# Patient Record
Sex: Female | Born: 1955 | Race: Black or African American | Hispanic: No | Marital: Single | Smoking: Never smoker
Health system: Southern US, Community
[De-identification: ages and names within clinical notes are randomized; demographics above are authoritative.]

## PROBLEM LIST (undated history)

## (undated) DIAGNOSIS — E039 Hypothyroidism, unspecified: Secondary | ICD-10-CM

## (undated) DIAGNOSIS — D219 Benign neoplasm of connective and other soft tissue, unspecified: Secondary | ICD-10-CM

## (undated) DIAGNOSIS — B9689 Other specified bacterial agents as the cause of diseases classified elsewhere: Secondary | ICD-10-CM

## (undated) DIAGNOSIS — A599 Trichomoniasis, unspecified: Secondary | ICD-10-CM

## (undated) DIAGNOSIS — N920 Excessive and frequent menstruation with regular cycle: Secondary | ICD-10-CM

## (undated) DIAGNOSIS — N760 Acute vaginitis: Secondary | ICD-10-CM

## (undated) DIAGNOSIS — L680 Hirsutism: Secondary | ICD-10-CM

## (undated) DIAGNOSIS — E282 Polycystic ovarian syndrome: Secondary | ICD-10-CM

## (undated) DIAGNOSIS — I10 Essential (primary) hypertension: Secondary | ICD-10-CM

## (undated) HISTORY — DX: Excessive and frequent menstruation with regular cycle: N92.0

## (undated) HISTORY — PX: DILATION AND CURETTAGE OF UTERUS: SHX78

## (undated) HISTORY — DX: Other specified bacterial agents as the cause of diseases classified elsewhere: B96.89

## (undated) HISTORY — DX: Polycystic ovarian syndrome: E28.2

## (undated) HISTORY — PX: HYSTEROSCOPY: SHX211

## (undated) HISTORY — DX: Hirsutism: L68.0

## (undated) HISTORY — DX: Benign neoplasm of connective and other soft tissue, unspecified: D21.9

## (undated) HISTORY — DX: Essential (primary) hypertension: I10

## (undated) HISTORY — DX: Trichomoniasis, unspecified: A59.9

## (undated) HISTORY — DX: Acute vaginitis: N76.0

## (undated) HISTORY — PX: TUBAL LIGATION: SHX77

## (undated) HISTORY — DX: Hypothyroidism, unspecified: E03.9

---

## 1997-12-22 ENCOUNTER — Other Ambulatory Visit: Admission: RE | Admit: 1997-12-22 | Discharge: 1997-12-22 | Payer: Self-pay | Admitting: Obstetrics and Gynecology

## 1999-07-14 ENCOUNTER — Other Ambulatory Visit: Admission: RE | Admit: 1999-07-14 | Discharge: 1999-07-14 | Payer: Self-pay | Admitting: Obstetrics and Gynecology

## 1999-07-15 ENCOUNTER — Encounter (INDEPENDENT_AMBULATORY_CARE_PROVIDER_SITE_OTHER): Payer: Self-pay

## 1999-07-15 ENCOUNTER — Other Ambulatory Visit: Admission: RE | Admit: 1999-07-15 | Discharge: 1999-07-15 | Payer: Self-pay | Admitting: Obstetrics and Gynecology

## 2000-08-15 ENCOUNTER — Encounter: Payer: Self-pay | Admitting: Internal Medicine

## 2000-08-15 ENCOUNTER — Ambulatory Visit (HOSPITAL_COMMUNITY): Admission: RE | Admit: 2000-08-15 | Discharge: 2000-08-15 | Payer: Self-pay | Admitting: Internal Medicine

## 2000-09-10 ENCOUNTER — Other Ambulatory Visit: Admission: RE | Admit: 2000-09-10 | Discharge: 2000-09-10 | Payer: Self-pay | Admitting: Obstetrics and Gynecology

## 2000-09-18 ENCOUNTER — Ambulatory Visit (HOSPITAL_COMMUNITY): Admission: RE | Admit: 2000-09-18 | Discharge: 2000-09-18 | Payer: Self-pay | Admitting: Obstetrics and Gynecology

## 2000-09-18 ENCOUNTER — Encounter: Payer: Self-pay | Admitting: Obstetrics and Gynecology

## 2001-09-30 ENCOUNTER — Other Ambulatory Visit: Admission: RE | Admit: 2001-09-30 | Discharge: 2001-09-30 | Payer: Self-pay | Admitting: Obstetrics and Gynecology

## 2002-10-22 ENCOUNTER — Other Ambulatory Visit: Admission: RE | Admit: 2002-10-22 | Discharge: 2002-10-22 | Payer: Self-pay | Admitting: Obstetrics and Gynecology

## 2003-10-28 ENCOUNTER — Other Ambulatory Visit: Admission: RE | Admit: 2003-10-28 | Discharge: 2003-10-28 | Payer: Self-pay | Admitting: Obstetrics and Gynecology

## 2003-11-12 ENCOUNTER — Ambulatory Visit (HOSPITAL_COMMUNITY): Admission: RE | Admit: 2003-11-12 | Discharge: 2003-11-12 | Payer: Self-pay | Admitting: Obstetrics and Gynecology

## 2004-10-31 ENCOUNTER — Other Ambulatory Visit: Admission: RE | Admit: 2004-10-31 | Discharge: 2004-10-31 | Payer: Self-pay | Admitting: Obstetrics and Gynecology

## 2007-10-02 ENCOUNTER — Encounter: Payer: Self-pay | Admitting: Internal Medicine

## 2007-10-02 ENCOUNTER — Ambulatory Visit: Payer: Self-pay | Admitting: Cardiovascular Disease

## 2007-10-02 ENCOUNTER — Ambulatory Visit (HOSPITAL_COMMUNITY): Admission: RE | Admit: 2007-10-02 | Discharge: 2007-10-02 | Payer: Self-pay | Admitting: Internal Medicine

## 2009-04-02 ENCOUNTER — Emergency Department (HOSPITAL_COMMUNITY): Admission: EM | Admit: 2009-04-02 | Discharge: 2009-04-03 | Payer: Self-pay | Admitting: Emergency Medicine

## 2009-06-03 ENCOUNTER — Ambulatory Visit (HOSPITAL_BASED_OUTPATIENT_CLINIC_OR_DEPARTMENT_OTHER): Admission: RE | Admit: 2009-06-03 | Discharge: 2009-06-03 | Payer: Self-pay | Admitting: Internal Medicine

## 2009-06-05 ENCOUNTER — Ambulatory Visit: Payer: Self-pay | Admitting: Internal Medicine

## 2010-04-10 ENCOUNTER — Encounter: Payer: Self-pay | Admitting: Occupational Medicine

## 2010-08-23 ENCOUNTER — Encounter: Payer: Self-pay | Admitting: Internal Medicine

## 2010-08-25 ENCOUNTER — Institutional Professional Consult (permissible substitution): Payer: Self-pay | Admitting: Internal Medicine

## 2010-09-29 ENCOUNTER — Institutional Professional Consult (permissible substitution): Payer: Self-pay | Admitting: Internal Medicine

## 2010-10-21 ENCOUNTER — Institutional Professional Consult (permissible substitution): Payer: Self-pay | Admitting: Internal Medicine

## 2011-12-20 ENCOUNTER — Ambulatory Visit (INDEPENDENT_AMBULATORY_CARE_PROVIDER_SITE_OTHER): Payer: Federal, State, Local not specified - PPO | Admitting: Obstetrics and Gynecology

## 2011-12-20 ENCOUNTER — Encounter: Payer: Self-pay | Admitting: Obstetrics and Gynecology

## 2011-12-20 VITALS — BP 138/78 | Ht 67.5 in | Wt 253.0 lb

## 2011-12-20 DIAGNOSIS — Z124 Encounter for screening for malignant neoplasm of cervix: Secondary | ICD-10-CM

## 2011-12-20 DIAGNOSIS — E039 Hypothyroidism, unspecified: Secondary | ICD-10-CM | POA: Insufficient documentation

## 2011-12-20 DIAGNOSIS — N920 Excessive and frequent menstruation with regular cycle: Secondary | ICD-10-CM | POA: Insufficient documentation

## 2011-12-20 DIAGNOSIS — A5901 Trichomonal vulvovaginitis: Secondary | ICD-10-CM

## 2011-12-20 DIAGNOSIS — N898 Other specified noninflammatory disorders of vagina: Secondary | ICD-10-CM

## 2011-12-20 LAB — POCT WET PREP (WET MOUNT): Clue Cells Wet Prep Whiff POC: POSITIVE

## 2011-12-20 LAB — POCT OSOM BVBLUE TEST: Bacterial Vaginosis: POSITIVE

## 2011-12-20 MED ORDER — ESTRADIOL-NORETHINDRONE ACET 0.05-0.25 MG/DAY TD PTTW: 1.0000 | MEDICATED_PATCH | TRANSDERMAL | Status: DC

## 2011-12-20 MED ORDER — FLUCONAZOLE 150 MG PO TABS: 150.0000 mg | ORAL_TABLET | Freq: Once | ORAL | Status: DC

## 2011-12-20 MED ORDER — METRONIDAZOLE 500 MG PO TABS: ORAL_TABLET | ORAL | Status: DC

## 2011-12-20 NOTE — Progress Notes (Signed)
aex Last Pap: 11/09/2010 WNL: Yes Regular Periods:no Contraception: BTL  Monthly Breast exam:yes Tetanus<51yrs:no Nl.Bladder Function:yes Daily BMs:yes Healthy Diet:yes Calcium:no Mammogram:yes Date of Mammogram: 02/13/2011 Exercise:yes Have often Exercise: 3 times weekly Seatbelt: yes Abuse at home: no Stressful work:no Sigmoid-colonoscopy: 2009  Polyps at [facility] GI Bone Density: No PCP: Dr. Chestine Spore Change in PMH: None Change in [facility]: None Pt c/o white milky vaginal discharge with no odor.  Subjective:    Marie Elliott is a 56 y.o. female G2P0 who presents for annual exam.  The patient has no complaints today. No vaginal bleeding. No rectal bleeding.  Has hx of colon polyps Good relief of hot flashes with Combipatch.  The following portions of the patient's history were reviewed and updated as appropriate: allergies, current medications, past family history, past medical history, past social history, past surgical history and problem list.  Review of Systems Pertinent items are noted in HPI. Gastrointestinal:No change in bowel habits, no abdominal pain, no rectal bleeding Genitourinary:negative for dysuria, frequency, hematuria, nocturia and urinary incontinence    Objective:     BP 138/78  Ht 5' 7.5" (1.715 m)  Wt 253 lb (114.76 kg)  BMI 39.04 kg/m2  Weight:  Wt Readings from Last 1 Encounters:  12/20/11 253 lb (114.76 kg)     BMI: Body mass index is 39.04 kg/(m^2). General Appearance: Alert, appropriate appearance for age. No acute distress HEENT: Grossly normal Neck / Thyroid: Supple, no masses, nodes or enlargement Lungs: clear to auscultation bilaterally Back: No CVA tenderness Breast Exam: No masses or nodes.No dimpling, nipple retraction or discharge. Cardiovascular: Regular rate and rhythm. S1, S2, no murmur Gastrointestinal: Soft, non-tender, no masses or organomegaly Pelvic Exam: External genitalia: normal general appearance Vaginal: atrophic  mucosa, discharge, pink and contact bleeding from vaginal sidewalls Cervix: atrophic with contact bleeding Adnexa: non palpable Uterus: doesnt feel enlarged Exam limited by body habitus Rectovaginal: normal rectal, no masses Lymphatic Exam: Non-palpable nodes in neck, clavicular, axillary, or inguinal regions Skin: no rash or abnormalities Neurologic: Normal gait and speech, no tremor  Psychiatric: Alert and oriented, appropriate affect.    Urinalysis:Not done OSOM BV: NEG OSOM TRICH:POS WET PREP: TRICH   Assessment:    Hormone replacement therapy  Recurrent vs persistent trichomonas   Plan:   mammogram pap smear return 4 weeks for wet prep and OSOM Follow-up:  for annual exam Metronidazole x 14 days for pt Metronidazole 2 gm stat for partner, Marie Elliott Condoms until clear at F/U

## 2011-12-22 LAB — PAP IG AND HPV HIGH-RISK: HPV DNA High Risk: NOT DETECTED

## 2012-01-25 ENCOUNTER — Encounter: Payer: Federal, State, Local not specified - PPO | Admitting: Obstetrics and Gynecology

## 2012-07-18 ENCOUNTER — Ambulatory Visit (INDEPENDENT_AMBULATORY_CARE_PROVIDER_SITE_OTHER): Payer: Federal, State, Local not specified - PPO | Admitting: Physician Assistant

## 2012-07-18 VITALS — BP 155/90 | HR 96 | Temp 98.1°F | Resp 18 | Ht 67.5 in | Wt 257.0 lb

## 2012-07-18 DIAGNOSIS — M62838 Other muscle spasm: Secondary | ICD-10-CM

## 2012-07-18 MED ORDER — MELOXICAM 15 MG PO TABS: 15.0000 mg | ORAL_TABLET | Freq: Every day | ORAL | Status: DC

## 2012-07-18 MED ORDER — CYCLOBENZAPRINE HCL 10 MG PO TABS: 10.0000 mg | ORAL_TABLET | Freq: Three times a day (TID) | ORAL | Status: DC | PRN

## 2012-07-18 NOTE — Progress Notes (Signed)
  Subjective:    Patient ID: Marie Elliott, female    DOB: 04-Nov-1955, 57 y.o.   MRN: [id number]  HPI   Marie Elliott is a very pleasant 57 yr old female here with concern for left shoulder pain.  States the shoulder began aching about 2 weeks ago.  Does not recall a mechanism of injury.  Does state that she carries her purse on that shoulder and that is often heavy.  Also carries a laptop bag on that shoulder occasionally.  Sleeps on the left side as well.  No specific movements exaggerate the pain, it just always aches.  The right side feels ok.  Has been doing up to 4 Aleve per day, though she is not consistently doing so.  When the weather is warm, the shoulder bothers her less.  Has been icing occasionally.     Review of Systems  Musculoskeletal: Positive for myalgias (left shoulder, left side neck).  Neurological: Negative for weakness and numbness.  All other systems reviewed and are negative.       Objective:   Physical Exam  Vitals reviewed. Constitutional: She is oriented to person, place, and time. She appears well-developed and well-nourished. No distress.  HENT:  Head: Normocephalic and atraumatic.  Eyes: Conjunctivae are normal. No scleral icterus.  Cardiovascular: Normal rate, regular rhythm and normal heart sounds.   Pulmonary/Chest: Effort normal and breath sounds normal. She has no wheezes. She has no rales.  Musculoskeletal:       Right shoulder: Normal.       Left shoulder: She exhibits normal range of motion, no tenderness, no bony tenderness, no crepitus and normal strength.       Cervical back: She exhibits spasm.       Back:  TTP and spasm over left trap  Neurological: She is alert and oriented to person, place, and time.  Skin: Skin is warm and dry.  Psychiatric: She has a normal mood and affect. Her behavior is normal.     Filed Vitals:   07/18/12 1417  BP: 155/90  Pulse: 96  Temp: 98.1 F (36.7 C)  Resp: 18        Assessment & Plan:    Trapezius muscle spasm - Plan: meloxicam (MOBIC) 15 MG tablet, cyclobenzaprine (FLEXERIL) 10 MG tablet   Marie Elliott is a very pleasant 57 yr old female here with 2 weeks of left shoulder pain.  On exam, shoulder joint itself is normal.  Full AROM and strength.  Tenderness and spasm over left trapezius.  Will try Mobic and Flexeril for 1-2 weeks.  Tylenol if needed for breakthrough pain.  Encouraged pt to carry purse/laptop on other shoulder.  Heat 2-3 times per day.  Instructed pt in gentle trap stretches.  If no improvement in 1-2 weeks may consider physical therapy.  Pt understands and is in agreement with this plan.

## 2012-07-18 NOTE — Patient Instructions (Addendum)
Begin taking the meloxicam (Mobic) once daily for the next 7-14 days.  (Do not take any additional Advil or Aleve while using the medication, if you need further pain relief you may take Tylenol)  Also begin taking cyclobenzaprine (Flexeril) at bedtime - if it does not make you too sleepy, you can take it every 8 hours.  Begin using heat to the area 2-3 times per day.  Do not carry anything on that shoulder for the next week.  Hopefully you will begin to see gradual improvement over this week.  If in two weeks you are not improved, we can consider trying physical therapy.

## 2014-05-21 ENCOUNTER — Ambulatory Visit (INDEPENDENT_AMBULATORY_CARE_PROVIDER_SITE_OTHER): Payer: Federal, State, Local not specified - PPO

## 2014-05-21 ENCOUNTER — Ambulatory Visit (INDEPENDENT_AMBULATORY_CARE_PROVIDER_SITE_OTHER): Payer: Federal, State, Local not specified - PPO | Admitting: Family Medicine

## 2014-05-21 VITALS — BP 140/88 | HR 78 | Temp 97.3°F | Resp 18 | Ht 67.5 in | Wt 246.8 lb

## 2014-05-21 DIAGNOSIS — M542 Cervicalgia: Secondary | ICD-10-CM | POA: Diagnosis not present

## 2014-05-21 DIAGNOSIS — M4317 Spondylolisthesis, lumbosacral region: Secondary | ICD-10-CM

## 2014-05-21 DIAGNOSIS — M549 Dorsalgia, unspecified: Secondary | ICD-10-CM

## 2014-05-21 DIAGNOSIS — M5137 Other intervertebral disc degeneration, lumbosacral region: Secondary | ICD-10-CM

## 2014-05-21 DIAGNOSIS — M47817 Spondylosis without myelopathy or radiculopathy, lumbosacral region: Secondary | ICD-10-CM

## 2014-05-21 DIAGNOSIS — M62830 Muscle spasm of back: Secondary | ICD-10-CM

## 2014-05-21 DIAGNOSIS — M51379 Other intervertebral disc degeneration, lumbosacral region without mention of lumbar back pain or lower extremity pain: Secondary | ICD-10-CM

## 2014-05-21 MED ORDER — MELOXICAM 7.5 MG PO TABS: 7.5000 mg | ORAL_TABLET | Freq: Every day | ORAL | Status: DC

## 2014-05-21 MED ORDER — CYCLOBENZAPRINE HCL 10 MG PO TABS: 10.0000 mg | ORAL_TABLET | Freq: Three times a day (TID) | ORAL | Status: DC | PRN
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
## 2014-06-09 ENCOUNTER — Other Ambulatory Visit: Payer: Self-pay | Admitting: Radiology

## 2014-11-25 ENCOUNTER — Ambulatory Visit (INDEPENDENT_AMBULATORY_CARE_PROVIDER_SITE_OTHER): Payer: Federal, State, Local not specified - PPO | Admitting: Family Medicine

## 2014-11-25 VITALS — BP 138/82 | HR 86 | Temp 98.2°F | Resp 16 | Ht 67.5 in | Wt 250.8 lb

## 2014-11-25 DIAGNOSIS — J0140 Acute pansinusitis, unspecified: Secondary | ICD-10-CM

## 2014-11-25 MED ORDER — PREDNISONE 20 MG PO TABS: ORAL_TABLET | ORAL | Status: DC

## 2014-11-25 MED ORDER — HYDROCOD POLST-CPM POLST ER 10-8 MG/5ML PO SUER: 5.0000 mL | Freq: Every evening | ORAL | Status: DC | PRN

## 2014-11-25 MED ORDER — AMOXICILLIN-POT CLAVULANATE 875-125 MG PO TABS: 1.0000 | ORAL_TABLET | Freq: Two times a day (BID) | ORAL | Status: DC

## 2014-11-25 NOTE — Patient Instructions (Signed)
Consider trying afrin for 3d only and pull out your netti pot/sinus rinse as well.  Sinusitis Sinusitis is redness, soreness, and inflammation of the paranasal sinuses. Paranasal sinuses are air pockets within the bones of your face (beneath the eyes, the middle of the forehead, or above the eyes). In healthy paranasal sinuses, mucus is able to drain out, and air is able to circulate through them by way of your nose. However, when your paranasal sinuses are inflamed, mucus and air can become trapped. This can allow bacteria and other germs to grow and cause infection. Sinusitis can develop quickly and last only a short time (acute) or continue over a long period (chronic). Sinusitis that lasts for more than 12 weeks is considered chronic.  CAUSES  Causes of sinusitis include:  Allergies.  Structural abnormalities, such as displacement of the cartilage that separates your nostrils (deviated septum), which can decrease the air flow through your nose and sinuses and affect sinus drainage.  Functional abnormalities, such as when the small hairs (cilia) that line your sinuses and help remove mucus do not work properly or are not present. SIGNS AND SYMPTOMS  Symptoms of acute and chronic sinusitis are the same. The primary symptoms are pain and pressure around the affected sinuses. Other symptoms include:  Upper toothache.  Earache.  Headache.  Bad breath.  Decreased sense of smell and taste.  A cough, which worsens when you are lying flat.  Fatigue.  Fever.  Thick drainage from your nose, which often is green and may contain pus (purulent).  Swelling and warmth over the affected sinuses. DIAGNOSIS  Your health care provider will perform a physical exam. During the exam, your health care provider may:  Look in your nose for signs of abnormal growths in your nostrils (nasal polyps).  Tap over the affected sinus to check for signs of infection.  View the inside of your sinuses  (endoscopy) using an imaging device that has a light attached (endoscope). If your health care provider suspects that you have chronic sinusitis, one or more of the following tests may be recommended:  Allergy tests.  Nasal culture. A sample of mucus is taken from your nose, sent to a lab, and screened for bacteria.  Nasal cytology. A sample of mucus is taken from your nose and examined by your health care provider to determine if your sinusitis is related to an allergy. TREATMENT  Most cases of acute sinusitis are related to a viral infection and will resolve on their own within 10 days. Sometimes medicines are prescribed to help relieve symptoms (pain medicine, decongestants, nasal steroid sprays, or saline sprays).  However, for sinusitis related to a bacterial infection, your health care provider will prescribe antibiotic medicines. These are medicines that will help kill the bacteria causing the infection.  Rarely, sinusitis is caused by a fungal infection. In theses cases, your health care provider will prescribe antifungal medicine. For some cases of chronic sinusitis, surgery is needed. Generally, these are cases in which sinusitis recurs more than 3 times per year, despite other treatments. HOME CARE INSTRUCTIONS   Drink plenty of water. Water helps thin the mucus so your sinuses can drain more easily.  Use a humidifier.  Inhale steam 3 to 4 times a day (for example, sit in the bathroom with the shower running).  Apply a warm, moist washcloth to your face 3 to 4 times a day, or as directed by your health care provider.  Use saline nasal sprays to help moisten  and clean your sinuses.  Take medicines only as directed by your health care provider.  If you were prescribed either an antibiotic or antifungal medicine, finish it all even if you start to feel better. SEEK IMMEDIATE MEDICAL CARE IF:  You have increasing pain or severe headaches.  You have nausea, vomiting, or  drowsiness.  You have swelling around your face.  You have vision problems.  You have a stiff neck.  You have difficulty breathing. MAKE SURE YOU:   Understand these instructions.  Will watch your condition.  Will get help right away if you are not doing well or get worse. Document Released: 03/06/2005 Document Revised: 07/21/2013 Document Reviewed: 03/21/2011 [employer] [facility] Patient Information 2015 [place], [place]. This information is not intended to replace advice given to you by your health care provider. Make sure you discuss any questions you have with your health care provider.

## 2014-11-25 NOTE — Progress Notes (Signed)
Subjective:    Patient ID: Marie Elliott, female    DOB: May 26, 1955, 59 y.o.   MRN: [id number] This chart was scribed for Delman Cheadle, MD by Zola Button, Medical Scribe. This patient was seen in Room 2 and the patient's care was started at 8:47 AM.   Chief Complaint  Patient presents with  . Cough    since last thursday    HPI HPI Comments: Marie Elliott is a 59 y.o. female who presents to the [facility] complaining of gradual onset, productive cough of yellowish sputum that started 6 days ago. Patient also reports having associated congestion, postnasal drip, and chills. The cough does interrupt her sleep and has not been improving. She has tried Nyquil and Robitussin but without relief. Patient denies fever, chest pain, SOB and palpitations. She also denies recent antibiotic use. She has not been on a course of steroids before. She recently returned from a vacation yesterday to the [place] area and [place] in [place]. She will be returning to [place], [place] next month for the opening of the [place].  Patient is retired.  Past Medical History  Diagnosis Date  . Trichomonas   . Fibroids   . Hypothyroidism   . Hirsutism   . Hypertension   . Polycystic ovaries   . Menorrhagia   . BV (bacterial vaginosis)    Current Outpatient Prescriptions on File Prior to Visit  Medication Sig Dispense Refill  . allopurinol (ZYLOPRIM) 300 MG tablet Take 150 mg by mouth daily.    Marland Kitchen AMLODIPINE BESYLATE PO Take by mouth.    Marland Kitchen LEVOTHYROXINE SODIUM PO Take by mouth.    . Multiple Vitamin (MULTIVITAMIN) tablet Take 1 tablet by mouth daily.    . Valsartan (DIOVAN PO) Take by mouth.    . cyclobenzaprine (FLEXERIL) 10 MG tablet Take 1 tablet (10 mg total) by mouth 3 (three) times daily as needed for muscle spasms. (Patient not taking: Reported on 11/25/2014) 30 tablet 1  . meloxicam (MOBIC) 7.5 MG tablet Take 1 tablet (7.5 mg total) by mouth daily.  (Patient not taking: Reported on 11/25/2014) 30 tablet 1   No current facility-administered medications on file prior to visit.   No Known Allergies  Review of Systems  Constitutional: Positive for chills. Negative for fever.  HENT: Positive for congestion and postnasal drip.   Respiratory: Positive for cough. Negative for shortness of breath.   Cardiovascular: Negative for chest pain and palpitations.       Objective:  BP 138/82 mmHg  Pulse 86  Temp(Src) 98.2 F (36.8 C) (Oral)  Resp 16  Ht 5' 7.5" (1.715 m)  Wt 250 lb 12.8 oz (113.762 kg)  BMI 38.68 kg/m2  SpO2 97%  Physical Exam  Constitutional: She is oriented to person, place, and time. She appears well-developed and well-nourished. No distress.  HENT:  Head: Normocephalic and atraumatic.  Right Ear: There is tenderness. Tympanic membrane is erythematous and bulging. A middle ear effusion is present.  Left Ear: There is tenderness. Tympanic membrane is erythematous and bulging. A middle ear effusion is present.  Nose: Mucosal edema and rhinorrhea present.  Mouth/Throat: Posterior oropharyngeal edema present. No oropharyngeal exudate.  Postnasal drip.  Eyes: Pupils are equal, round, and reactive to light.  Neck: Neck supple. Thyromegaly present.  Cardiovascular: Normal rate and regular rhythm.   Murmur heard.  Systolic murmur is present with a grade of 2/6  2/6 systolic ejection murmur, right upper sternal border.  Pulmonary/Chest: Effort normal and breath sounds normal. No respiratory distress. She has no wheezes. She has no rales.  Excellent air movement. Clear to auscultation bilaterally.   Musculoskeletal: She exhibits no edema.  Lymphadenopathy:    She has no cervical adenopathy.  Neurological: She is alert and oriented to person, place, and time. No cranial nerve deficit.  Skin: Skin is warm and dry. No rash noted.  Psychiatric: She has a normal mood and affect. Her behavior is normal.  Nursing note and vitals  reviewed.         Assessment & Plan:   1. Acute pansinusitis, recurrence not specified     Meds ordered this encounter  Medications  . amoxicillin-clavulanate (AUGMENTIN) 875-125 MG per tablet    Sig: Take 1 tablet by mouth 2 (two) times daily.    Dispense:  20 tablet    Refill:  0  . predniSONE (DELTASONE) 20 MG tablet    Sig: 3 tabs po qd x 3d, 2 tabs po qd x 3d, 1 tab po qd x 3d    Dispense:  18 tablet    Refill:  0  . chlorpheniramine-HYDROcodone (TUSSIONEX PENNKINETIC ER) 10-8 MG/5ML SUER    Sig: Take 5 mLs by mouth at bedtime as needed for cough.    Dispense:  120 mL    Refill:  0    I personally performed the services described in this documentation, which was scribed in my presence. The recorded information has been reviewed and considered, and addended by me as needed.  Delman Cheadle, MD MPH

## 2015-05-21 ENCOUNTER — Other Ambulatory Visit: Payer: Self-pay | Admitting: Gastroenterology
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
## 2016-09-29 ENCOUNTER — Other Ambulatory Visit: Payer: Self-pay | Admitting: Physician Assistant

## 2016-09-29 DIAGNOSIS — R1310 Dysphagia, unspecified: Secondary | ICD-10-CM

## 2016-10-05 ENCOUNTER — Other Ambulatory Visit: Payer: Self-pay

## 2016-10-06 ENCOUNTER — Ambulatory Visit
Admission: RE | Admit: 2016-10-06 | Discharge: 2016-10-06 | Disposition: A | Discharge status: Home / Self Care | Payer: No Typology Code available for payment source | Source: Ambulatory Visit | Attending: Physician Assistant | Admitting: Physician Assistant

## 2016-10-06 DIAGNOSIS — R1310 Dysphagia, unspecified: Secondary | ICD-10-CM

## 2019-01-10 ENCOUNTER — Other Ambulatory Visit: Payer: Self-pay

## 2019-01-10 DIAGNOSIS — Z20822 Contact with and (suspected) exposure to covid-19: Secondary | ICD-10-CM

## 2019-01-11 LAB — NOVEL CORONAVIRUS, NAA: SARS-CoV-2, NAA: NOT DETECTED

## 2019-01-31 ENCOUNTER — Other Ambulatory Visit: Payer: Self-pay

## 2019-01-31 DIAGNOSIS — Z20822 Contact with and (suspected) exposure to covid-19: Secondary | ICD-10-CM

## 2019-02-02 LAB — NOVEL CORONAVIRUS, NAA: SARS-CoV-2, NAA: NOT DETECTED

## 2019-03-11 ENCOUNTER — Other Ambulatory Visit: Payer: No Typology Code available for payment source

## 2019-04-09 ENCOUNTER — Ambulatory Visit: Payer: No Typology Code available for payment source | Attending: Internal Medicine

## 2019-04-09 DIAGNOSIS — Z23 Encounter for immunization: Secondary | ICD-10-CM | POA: Insufficient documentation

## 2019-04-09 NOTE — Progress Notes (Signed)
   Covid-19 Vaccination Clinic  Name:  Marie Elliott    MRN: [id number] DOB: 12/08/1955  04/09/2019  [name] was observed post Covid-19 immunization for 15 minutes without incidence. She was provided with Vaccine Information Sheet and instruction to access the V-Safe system.   [name] was instructed to call 911 with any severe reactions post vaccine: Marland Kitchen Difficulty breathing  . Swelling of your face and throat  . A fast heartbeat  . A bad rash all over your body  . Dizziness and weakness    Immunizations Administered    Name Date Dose VIS Date Route   Pfizer COVID-19 Vaccine 04/09/2019  6:09 PM 0.3 mL 02/28/2019 Intramuscular   Manufacturer: [place]   Lot: [id number]   [place]: [id number]

## 2019-05-01 ENCOUNTER — Ambulatory Visit: Payer: No Typology Code available for payment source | Attending: Internal Medicine

## 2019-05-01 DIAGNOSIS — Z23 Encounter for immunization: Secondary | ICD-10-CM | POA: Insufficient documentation

## 2019-05-01 NOTE — Progress Notes (Signed)
   Covid-19 Vaccination Clinic  Name:  Marie Elliott    MRN: [id number] DOB: November 20, 1955  05/01/2019  [name] was observed post Covid-19 immunization for 15 minutes without incidence. She was provided with Vaccine Information Sheet and instruction to access the V-Safe system.   [name] was instructed to call 911 with any severe reactions post vaccine: Marland Kitchen Difficulty breathing  . Swelling of your face and throat  . A fast heartbeat  . A bad rash all over your body  . Dizziness and weakness    Immunizations Administered    Name Date Dose VIS Date Route   Pfizer COVID-19 Vaccine 05/01/2019  8:07 AM 0.3 mL 02/28/2019 Intramuscular   Manufacturer: [place]   Lot: [id number]   [place]: [id number]

## 2019-11-28 ENCOUNTER — Other Ambulatory Visit: Payer: Self-pay

## 2019-11-28 ENCOUNTER — Other Ambulatory Visit: Payer: No Typology Code available for payment source

## 2019-11-28 DIAGNOSIS — Z20822 Contact with and (suspected) exposure to covid-19: Secondary | ICD-10-CM

## 2019-12-01 LAB — NOVEL CORONAVIRUS, NAA: SARS-CoV-2, NAA: NOT DETECTED

## 2020-04-09 ENCOUNTER — Other Ambulatory Visit: Payer: Self-pay | Admitting: Internal Medicine

## 2020-04-09 DIAGNOSIS — N1832 Chronic kidney disease, stage 3b: Secondary | ICD-10-CM

## 2020-04-21 ENCOUNTER — Ambulatory Visit
Admission: RE | Admit: 2020-04-21 | Discharge: 2020-04-21 | Disposition: A | Discharge status: Home / Self Care | Payer: No Typology Code available for payment source | Source: Ambulatory Visit | Attending: Internal Medicine | Admitting: Internal Medicine

## 2020-04-21 DIAGNOSIS — N1832 Chronic kidney disease, stage 3b: Secondary | ICD-10-CM

## 2020-06-09 ENCOUNTER — Other Ambulatory Visit: Payer: Self-pay | Admitting: Obstetrics and Gynecology

## 2020-12-29 ENCOUNTER — Other Ambulatory Visit: Payer: Self-pay

## 2020-12-29 ENCOUNTER — Emergency Department (HOSPITAL_BASED_OUTPATIENT_CLINIC_OR_DEPARTMENT_OTHER)
Admission: EM | Admit: 2020-12-29 | Discharge: 2020-12-29 | Disposition: A | Discharge status: Home / Self Care | Payer: No Typology Code available for payment source | Attending: Emergency Medicine | Admitting: Emergency Medicine

## 2020-12-29 ENCOUNTER — Emergency Department (HOSPITAL_BASED_OUTPATIENT_CLINIC_OR_DEPARTMENT_OTHER): Payer: No Typology Code available for payment source

## 2020-12-29 ENCOUNTER — Encounter (HOSPITAL_BASED_OUTPATIENT_CLINIC_OR_DEPARTMENT_OTHER): Payer: Self-pay | Admitting: Obstetrics and Gynecology

## 2020-12-29 DIAGNOSIS — S0101XA Laceration without foreign body of scalp, initial encounter: Secondary | ICD-10-CM | POA: Insufficient documentation

## 2020-12-29 DIAGNOSIS — Z79899 Other long term (current) drug therapy: Secondary | ICD-10-CM | POA: Insufficient documentation

## 2020-12-29 DIAGNOSIS — Z23 Encounter for immunization: Secondary | ICD-10-CM | POA: Diagnosis not present

## 2020-12-29 DIAGNOSIS — I10 Essential (primary) hypertension: Secondary | ICD-10-CM | POA: Diagnosis not present

## 2020-12-29 DIAGNOSIS — W01198A Fall on same level from slipping, tripping and stumbling with subsequent striking against other object, initial encounter: Secondary | ICD-10-CM | POA: Insufficient documentation

## 2020-12-29 DIAGNOSIS — E039 Hypothyroidism, unspecified: Secondary | ICD-10-CM | POA: Insufficient documentation

## 2020-12-29 DIAGNOSIS — E041 Nontoxic single thyroid nodule: Secondary | ICD-10-CM | POA: Diagnosis not present

## 2020-12-29 DIAGNOSIS — S0990XA Unspecified injury of head, initial encounter: Secondary | ICD-10-CM | POA: Diagnosis present

## 2020-12-29 MED ORDER — TETANUS-DIPHTH-ACELL PERTUSSIS 5-2.5-18.5 LF-MCG/0.5 IM SUSY
0.5000 mL | PREFILLED_SYRINGE | Freq: Once | INTRAMUSCULAR | Status: AC
  Administered 2020-12-29: 0.5 mL via INTRAMUSCULAR
  Filled 2020-12-29: qty 0.5

## 2020-12-29 NOTE — Discharge Instructions (Addendum)
Follow-up with your primary care doctor in 7 to 10 days for staple removal.  You also incidentally were found to have a thyroid nodule and will need an outpatient ultrasound to further evaluate this.

## 2020-12-29 NOTE — ED Provider Notes (Signed)
[facility] EMERGENCY DEPT Provider Note   CSN: [id number] Arrival date & time: 12/29/20  1419     History Chief Complaint  Patient presents with   Head Injury    Marie Elliott is a 65 y.o. female.  The history is provided by the patient.  Head Injury Location:  R temporal Time since incident:  2 hours Mechanism of injury: fall   Pain details:    Quality:  Aching   Severity:  Mild   Timing:  Intermittent   Progression:  Waxing and waning Chronicity:  New Relieved by:  Nothing Worsened by:  Nothing Associated symptoms: headache   Associated symptoms: no blurred vision, no difficulty breathing, no disorientation, no double vision, no focal weakness, no hearing loss, no loss of consciousness, no memory loss, no nausea, no neck pain, no numbness, no seizures, no tinnitus and no vomiting       Past Medical History:  Diagnosis Date   BV (bacterial vaginosis)    Fibroids    Hirsutism    Hypertension    Hypothyroidism    Menorrhagia    Polycystic ovaries    Trichomonas     Patient Active Problem List   Diagnosis Date Noted   Hypothyroidism    Menorrhagia     Past Surgical History:  Procedure Laterality Date   DILATION AND CURETTAGE OF UTERUS     HYSTEROSCOPY     TUBAL LIGATION       OB History     Gravida  2   Para  0   Term      Preterm      AB      Living  0      SAB      IAB      Ectopic      Multiple      Live Births              Family History  Problem Relation Age of Onset   Alzheimer's disease Mother    Hypertension Mother    Cancer Father    Hypertension Father    Gout Father    Hypertension Sister    Cancer Brother    Hypertension Brother    Diabetes Maternal Grandmother    Cancer Maternal Grandfather    Epilepsy Brother     Social History   Tobacco Use   Smoking status: Never    Passive exposure: Never   Smokeless tobacco: Never  Vaping Use   Vaping Use: Never used  Substance Use  Topics   Alcohol use: Yes    Comment: socially   Drug use: No    Home Medications Prior to Admission medications   Medication Sig Start Date End Date Taking? Authorizing Provider  allopurinol (ZYLOPRIM) 300 MG tablet Take 150 mg by mouth daily.    [provider]  AMLODIPINE BESYLATE PO Take by mouth.    [provider]  amoxicillin-clavulanate (AUGMENTIN) 875-125 MG per tablet Take 1 tablet by mouth 2 (two) times daily. 11/25/14   Shawnee Knapp, MD  chlorpheniramine-HYDROcodone [facility] PENNKINETIC ER) 10-8 MG/5ML SUER Take 5 mLs by mouth at bedtime as needed for cough. 11/25/14   Shawnee Knapp, MD  cyclobenzaprine (FLEXERIL) 10 MG tablet Take 1 tablet (10 mg total) by mouth 3 (three) times daily as needed for muscle spasms. Patient not taking: Reported on 11/25/2014 05/21/14   Shawnee Knapp, MD  LEVOTHYROXINE SODIUM PO Take by mouth.    [provider]  meloxicam (MOBIC) 7.5 MG tablet Take 1 tablet (7.5 mg total) by mouth daily. Patient not taking: Reported on 11/25/2014 05/21/14   Shawnee Knapp, MD  Multiple Vitamin (MULTIVITAMIN) tablet Take 1 tablet by mouth daily.    [provider]  predniSONE (DELTASONE) 20 MG tablet 3 tabs po qd x 3d, 2 tabs po qd x 3d, 1 tab po qd x 3d 11/25/14   Shawnee Knapp, MD  Valsartan (DIOVAN PO) Take by mouth.    [provider]    Allergies    Patient has no known allergies.  Review of Systems   Review of Systems  Constitutional:  Negative for chills and fever.  HENT:  Negative for ear pain, hearing loss, sore throat and tinnitus.   Eyes:  Negative for blurred vision, double vision, pain and visual disturbance.  Respiratory:  Negative for cough and shortness of breath.   Cardiovascular:  Negative for chest pain and palpitations.  Gastrointestinal:  Negative for abdominal pain, nausea and vomiting.  Genitourinary:  Negative for dysuria and hematuria.  Musculoskeletal:  Negative for arthralgias, back pain and neck pain.   Skin:  Positive for wound. Negative for color change and rash.  Neurological:  Positive for headaches. Negative for focal weakness, seizures, loss of consciousness, syncope and numbness.  Psychiatric/Behavioral:  Negative for memory loss.   All other systems reviewed and are negative.  Physical Exam Updated Vital Signs BP 125/72   Pulse 62   Temp 99 F (37.2 C)   Resp 16   Ht 5\' 8"  (1.727 m)   Wt 85.3 kg   SpO2 100%   BMI 28.59 kg/m   Physical Exam Vitals and nursing note reviewed.  Constitutional:      General: She is not in acute distress.    Appearance: She is well-developed.  HENT:     Head:     Comments: 3 cm superficial laceration to the right part of the scalp that is hemostatic Eyes:     Extraocular Movements: Extraocular movements intact.     Conjunctiva/sclera: Conjunctivae normal.     Pupils: Pupils are equal, round, and reactive to light.  Cardiovascular:     Rate and Rhythm: Normal rate and regular rhythm.     Heart sounds: No murmur heard. Pulmonary:     Effort: Pulmonary effort is normal. No respiratory distress.     Breath sounds: Normal breath sounds.  Abdominal:     Palpations: Abdomen is soft.     Tenderness: There is no abdominal tenderness.  Musculoskeletal:        General: No tenderness. Normal range of motion.     Cervical back: Normal range of motion and neck supple. No tenderness.  Skin:    General: Skin is warm and dry.     Capillary Refill: Capillary refill takes less than 2 seconds.  Neurological:     General: No focal deficit present.     Mental Status: She is alert and oriented to person, place, and time.     Cranial Nerves: No cranial nerve deficit.     Sensory: No sensory deficit.     Motor: No weakness.     Coordination: Coordination normal.    ED Results / Procedures / Treatments   Labs (all labs ordered are listed, but only abnormal results are displayed) Labs Reviewed - No data to display  EKG None  Radiology CT Head  Wo Contrast  Result Date: 12/29/2020 CLINICAL DATA:  Patient fell  4, striking head on wall. No loss of consciousness. Headache and neck pain. EXAM: CT HEAD WITHOUT CONTRAST CT CERVICAL SPINE WITHOUT CONTRAST TECHNIQUE: Multidetector CT imaging of the head and cervical spine was performed following the standard protocol without intravenous contrast. Multiplanar CT image reconstructions of the cervical spine were also generated. COMPARISON:  None. FINDINGS: CT HEAD FINDINGS Brain: There is no evidence of acute intracranial hemorrhage, mass lesion, brain edema or extra-axial fluid collection. The ventricles and subarachnoid spaces are appropriately sized for age. There is no CT evidence of acute cortical infarction. Probable mild chronic small vessel ischemic changes in the periventricular white matter. Vascular: Intracranial vascular calcifications. No hyperdense vessel identified. Skull: Diffuse calvarial hyperostosis. No evidence of acute fracture. Sinuses/Orbits: The visualized paranasal sinuses and mastoid air cells are clear. No orbital abnormalities are seen. Other: None. CT CERVICAL SPINE FINDINGS Alignment: Normal. Skull base and vertebrae: No evidence of acute fracture or traumatic subluxation. Chronic calcification of the ligamentum nuchae adjacent to the C7 spinous process. Multilevel spondylosis with fragmented endplate osteophytes. Soft tissues and spinal canal: No prevertebral fluid or swelling. No visible canal hematoma. Disc levels: Multilevel spondylosis, greatest at C5-6. Mild associated foraminal narrowing, worse on the left. No large disc herniation identified. Upper chest: Suspected 2.1 cm left thyroid nodule on image 76/4. The visualized upper chest otherwise appears unremarkable. Other: None. IMPRESSION: 1. No acute intracranial or calvarial findings. 2. No evidence of acute cervical spine fracture, traumatic subluxation or static signs of instability. 3. Cervical spondylosis as described. 4.  Thyroid heterogeneity with suspected 2.1 cm left-sided nodule. Recommend thyroid US.(Ref: J Am Coll Radiol. 2015 Feb;12(2): 143-50). Electronically Signed   By: Richardean Sale M.D.   On: 12/29/2020 15:33   CT Cervical Spine Wo Contrast  Result Date: 12/29/2020 CLINICAL DATA:  Patient fell 4, striking head on wall. No loss of consciousness. Headache and neck pain. EXAM: CT HEAD WITHOUT CONTRAST CT CERVICAL SPINE WITHOUT CONTRAST TECHNIQUE: Multidetector CT imaging of the head and cervical spine was performed following the standard protocol without intravenous contrast. Multiplanar CT image reconstructions of the cervical spine were also generated. COMPARISON:  None. FINDINGS: CT HEAD FINDINGS Brain: There is no evidence of acute intracranial hemorrhage, mass lesion, brain edema or extra-axial fluid collection. The ventricles and subarachnoid spaces are appropriately sized for age. There is no CT evidence of acute cortical infarction. Probable mild chronic small vessel ischemic changes in the periventricular white matter. Vascular: Intracranial vascular calcifications. No hyperdense vessel identified. Skull: Diffuse calvarial hyperostosis. No evidence of acute fracture. Sinuses/Orbits: The visualized paranasal sinuses and mastoid air cells are clear. No orbital abnormalities are seen. Other: None. CT CERVICAL SPINE FINDINGS Alignment: Normal. Skull base and vertebrae: No evidence of acute fracture or traumatic subluxation. Chronic calcification of the ligamentum nuchae adjacent to the C7 spinous process. Multilevel spondylosis with fragmented endplate osteophytes. Soft tissues and spinal canal: No prevertebral fluid or swelling. No visible canal hematoma. Disc levels: Multilevel spondylosis, greatest at C5-6. Mild associated foraminal narrowing, worse on the left. No large disc herniation identified. Upper chest: Suspected 2.1 cm left thyroid nodule on image 76/4. The visualized upper chest otherwise appears  unremarkable. Other: None. IMPRESSION: 1. No acute intracranial or calvarial findings. 2. No evidence of acute cervical spine fracture, traumatic subluxation or static signs of instability. 3. Cervical spondylosis as described. 4. Thyroid heterogeneity with suspected 2.1 cm left-sided nodule. Recommend thyroid US.(Ref: J Am Coll Radiol. 2015 Feb;12(2): 143-50). Electronically Signed   By: Richardean Sale  M.D.   On: 12/29/2020 15:33    Procedures .Marland KitchenLaceration Repair  Date/Time: 12/29/2020 3:17 PM Performed by: Lennice Sites, DO Authorized by: Lennice Sites, DO   Consent:    Consent obtained:  Verbal   Consent given by:  Patient   Risks, benefits, and alternatives were discussed: yes     Risks discussed:  Infection, need for additional repair, pain, poor cosmetic result, poor wound healing, nerve damage, retained foreign body, tendon damage and vascular damage Universal protocol:    Procedure explained and questions answered to patient or proxy's satisfaction: yes     Relevant documents present and verified: yes     Test results available: yes     Imaging studies available: yes     Patient identity confirmed:  Verbally with patient Anesthesia:    Anesthesia method:  None Laceration details:    Location:  Scalp   Scalp location:  R temporal   Length (cm):  3   Depth (mm):  1 Pre-procedure details:    Preparation:  Patient was prepped and draped in usual sterile fashion Exploration:    Limited defect created (wound extended): no     Imaging outcome: foreign body not noted     Wound exploration: wound explored through full range of motion and entire depth of wound visualized     Wound extent: no areolar tissue violation noted, no fascia violation noted, no foreign bodies/material noted, no muscle damage noted, no nerve damage noted, no tendon damage noted, no underlying fracture noted and no vascular damage noted     Contaminated: no   Treatment:    Area cleansed with:  Shur-Clens    Amount of cleaning:  Standard   Debridement:  None   Undermining:  None   Scar revision: no   Skin repair:    Repair method:  Staples   Number of staples:  2 Approximation:    Approximation:  Close Repair type:    Repair type:  Simple Post-procedure details:    Dressing:  Open (no dressing)   Procedure completion:  Tolerated   Medications Ordered in ED Medications  Tdap (BOOSTRIX) injection 0.5 mL (has no administration in time range)    ED Course  I have reviewed the triage vital signs and the nursing notes.  Pertinent labs & imaging results that were available during my care of the patient were reviewed by me and considered in my medical decision making (see chart for details).    MDM Rules/Calculators/A&P                           DANYLE BOENING is here after mechanical fall.  Tripped and fell onto sheet rock wall.  Normal vitals.  Laceration to scalp repaired with staples.  Hemostatic.  Head and neck CT unremarkable.  No acute traumatic injuries.  Incidental thyroid nodule found.  Understands to follow-up with primary care doctor for staple removal and thyroid ultrasound.  No other tenderness elsewhere.  Neurologically intact.  No midline spinal tenderness.  Discharged in good condition.  Tetanus shot updated.  This chart was dictated using voice recognition software.  Despite best efforts to proofread,  errors can occur which can change the documentation meaning.    Final Clinical Impression(s) / ED Diagnoses Final diagnoses:  Laceration of scalp, initial encounter  Thyroid nodule    Rx / DC Orders ED Discharge Orders     None        Lakyla Biswas,  [place], [place] 12/29/20 1539

## 2020-12-29 NOTE — ED Triage Notes (Signed)
Patient reports to the ER for a head injury. Patient reports she fell forward and hit her head into the wall, putting a hole in the sheetrock. Patient states she tripped over some cords. Denies LOC, denies bloodthinners, denies N/V.

## 2020-12-29 NOTE — ED Notes (Signed)
Patient transported to CT 

## 2021-01-31 ENCOUNTER — Encounter (HOSPITAL_COMMUNITY): Payer: Self-pay | Admitting: Radiology

## 2022-02-21 ENCOUNTER — Encounter: Payer: Self-pay | Admitting: Orthopaedic Surgery

## 2022-02-21 ENCOUNTER — Ambulatory Visit (INDEPENDENT_AMBULATORY_CARE_PROVIDER_SITE_OTHER): Payer: No Typology Code available for payment source | Admitting: Orthopaedic Surgery

## 2022-02-21 DIAGNOSIS — M65332 Trigger finger, left middle finger: Secondary | ICD-10-CM | POA: Diagnosis not present

## 2022-02-21 DIAGNOSIS — R202 Paresthesia of skin: Secondary | ICD-10-CM | POA: Diagnosis not present

## 2022-02-21 MED ORDER — METHYLPREDNISOLONE ACETATE 40 MG/ML IJ SUSP
13.3300 mg | INTRAMUSCULAR | Status: AC | PRN
  Administered 2022-02-21: 13.33 mg

## 2022-02-21 MED ORDER — LIDOCAINE HCL 1 % IJ SOLN
0.3000 mL | INTRAMUSCULAR | Status: AC | PRN
  Administered 2022-02-21: .3 mL

## 2022-02-21 MED ORDER — BUPIVACAINE HCL 0.5 % IJ SOLN
0.3300 mL | INTRAMUSCULAR | Status: AC | PRN
  Administered 2022-02-21: .33 mL

## 2022-02-21 NOTE — Progress Notes (Signed)
Office Visit Note   Patient: Marie Elliott           Date of Birth: 1955-08-29           MRN: [id number] Visit Date: 02/21/2022              Requested by: Jolinda Croak, MD [address],  [place] [id number] PCP: Jolinda Croak, MD   Assessment & Plan: Visit Diagnoses:  1. Trigger finger, left middle finger   2. Paresthesias in right hand     Plan: Impression is right hand carpal tunnel syndrome and left hand long finger trigger finger.  In regards to the carpal tunnel syndrome, I would like to refer her to Dr. Ernestina Patches for nerve conduction study.  She will follow-up with [place] once this has been completed.  In regards to the trigger finger, we have discussed proceeding with cortisone injection today in addition to night splinting.  She is agreeable to this plan.  Call with concerns or questions.  Follow-Up Instructions: Return for after NCS.   Orders:  No orders of the defined types were placed in this encounter.  No orders of the defined types were placed in this encounter.     Procedures: Hand/UE Inj: L long A1 for trigger finger on 02/21/2022 8:54 PM Indications: pain Details: 25 G needle Medications: 0.3 mL lidocaine 1 %; 0.33 mL bupivacaine 0.5 %; 13.33 mg methylPREDNISolone acetate 40 MG/ML Outcome: tolerated well, no immediate complications Consent was given by the patient. Patient was prepped and draped in the usual sterile fashion.       Clinical Data: No additional findings.   Subjective: Chief Complaint  Patient presents with   Right Hand - Pain   Left Hand - Pain    Middle finger    HPI thinks patient is a pleasant 66 year old right-hand-dominant female who comes in today with right hand paresthesias as well as left long finger triggering.  In regards to her right hand, she has noticed tingling to the thumb, index, long and ring fingers for the past few months.  Symptoms have not worsened but have not improved.  She  frequently wakes up at night shaking her hands.  She also has symptoms when driving her car.  She has tried wearing a wrist splint which does not help.  No previous cortisone injection.  In regards to the left hand long finger, she noticed symptoms just prior to the onset of the right hand paresthesias.  She is noticing primarily triggering with minimal pain to the A1 pulley.  No previous trigger finger history of diabetes.  Review of Systems as detailed in HPI.  All others reviewed and are negative.   Objective: Vital Signs: There were no vitals taken for this visit.  Physical Exam well-developed well-nourished female in no acute distress.  Alert and oriented x3.  Ortho Exam right hand exam reveals a negative Phalen and negative Tinel at the wrist.  No thenar atrophy.  She is neurovascular intact distally.  Left hand exam shows pain as well as a palpable nodule over the A1 pulley of the long finger.  She does have reproducible triggering.  She is neurovascular intact distally.  Specialty Comments:  No specialty comments available.  Imaging: No new imaging   PMFS History: Patient Active Problem List   Diagnosis Date Noted   Hypothyroidism    Menorrhagia    Past Medical History:  Diagnosis Date   BV (bacterial vaginosis)  Fibroids    Hirsutism    Hypertension    Hypothyroidism    Menorrhagia    Polycystic ovaries    Trichomonas     Family History  Problem Relation Age of Onset   Alzheimer's disease Mother    Hypertension Mother    Cancer Father    Hypertension Father    Gout Father    Hypertension Sister    Cancer Brother    Hypertension Brother    Diabetes Maternal Grandmother    Cancer Maternal Grandfather    Epilepsy Brother     Past Surgical History:  Procedure Laterality Date   DILATION AND CURETTAGE OF UTERUS     HYSTEROSCOPY     TUBAL LIGATION     Social History   Occupational History   Not on file  Tobacco Use   Smoking status: Never    Passive  exposure: Never   Smokeless tobacco: Never  Vaping Use   Vaping Use: Never used  Substance and Sexual Activity   Alcohol use: Yes    Comment: socially   Drug use: No   Sexual activity: Not Currently    Birth control/protection: Surgical

## 2022-03-10 ENCOUNTER — Ambulatory Visit (INDEPENDENT_AMBULATORY_CARE_PROVIDER_SITE_OTHER): Payer: No Typology Code available for payment source | Admitting: Physical Medicine and Rehabilitation

## 2022-03-10 DIAGNOSIS — R202 Paresthesia of skin: Secondary | ICD-10-CM | POA: Diagnosis not present

## 2022-03-10 NOTE — Progress Notes (Signed)
Functional Pain Scale - descriptive words and definitions  Distracting (5)    Aware of pain/able to complete some ADL's but limited by pain/sleep is affected and active distractions are only slightly useful. Moderate range order  Average Pain 5  Right handed. Right hand pain with numbness and tingling. Hard to carry and grasp things

## 2022-03-21 NOTE — Procedures (Signed)
EMG & NCV Findings: Evaluation of the right median motor nerve showed prolonged distal onset latency (5.8 ms), reduced amplitude (4.4 mV), and decreased conduction velocity (Elbow-Wrist, 45 m/s).  The right median (across palm) sensory nerve showed prolonged distal peak latency (Wrist, 11.1 ms), reduced amplitude (7.1 V), and prolonged distal peak latency (Palm, 3.1 ms).  All remaining nerves (as indicated in the following tables) were within normal limits.    All examined muscles (as indicated in the following table) showed no evidence of electrical instability.    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  There was no other focal nerve neuropathy, cervical radiculopathy or brachial plexopathy.  As you know, this particular electrodiagnostic study cannot rule out chemical radiculitis or sensory only radiculopathy.  Recommendations: 1.  Follow-up with referring physician. 2.  Consider surgical evaluation. ___________________________ Laurence Spates FAAPMR Board Certified, American Board of Physical Medicine and Rehabilitation    Nerve Conduction Studies Anti Sensory Summary Table   Stim Site NR Peak (ms) Norm Peak (ms) P-T Amp (V) Norm P-T Amp Site1 Site2 Delta-P (ms) Dist (cm) Vel (m/s) Norm Vel (m/s)  Right Median Acr Palm Anti Sensory (2nd Digit)  31.4C  Wrist    *11.1 <3.6 *7.1 >10 Wrist Palm 8.0 0.0    Palm    *3.1 <2.0 9.6         Right Radial Anti Sensory (Base 1st Digit)  31.1C  Wrist    2.3 <3.1 18.7  Wrist Base 1st Digit 2.3 0.0    Right Ulnar Anti Sensory (5th Digit)  31.9C  Wrist    3.3 <3.7 21.3 >15.0 Wrist 5th Digit 3.3 14.0 42 >38   Motor Summary Table   Stim Site NR Onset (ms) Norm Onset (ms) O-P Amp (mV) Norm O-P Amp Site1 Site2 Delta-0 (ms) Dist (cm) Vel (m/s) Norm Vel (m/s)  Right Median Motor (Abd Poll Brev)  32C   Wrist    *5.8 <4.2 *4.4 >5 Elbow Wrist 4.9 22.0 *45 >50  Elbow    10.7  4.3         Right Ulnar Motor (Abd Dig Min)  32.2C  Wrist    2.9 <4.2 6.9 >3 B Elbow Wrist 3.9 21.0 54 >53  B Elbow    6.8  6.5  A Elbow B Elbow 1.8 10.0 56 >53  A Elbow    8.6  6.4          EMG   Side Muscle Nerve Root Ins Act Fibs Psw Amp Dur Poly Recrt Int Fraser Din Comment  Right Abd Poll Brev Median C8-T1 Nml Nml Nml Nml Nml 0 Nml Nml   Right 1stDorInt Ulnar C8-T1 Nml Nml Nml Nml Nml 0 Nml Nml   Right PronatorTeres Median C6-7 Nml Nml Nml Nml Nml 0 Nml Nml   Right Biceps Musculocut C5-6 Nml Nml Nml Nml Nml 0 Nml Nml   Right Deltoid Axillary C5-6 Nml Nml Nml Nml Nml 0 Nml Nml     Nerve Conduction Studies Anti Sensory Left/Right Comparison   Stim Site L Lat (ms) R Lat (ms) L-R Lat (ms) L Amp (V) R Amp (V) L-R Amp (%) Site1 Site2 L Vel (m/s) R Vel (m/s) L-R Vel (m/s)  Median Acr Palm Anti Sensory (2nd Digit)  31.4C  Wrist  *11.1   *7.1  Wrist Palm     Palm  *3.1  9.6        Radial Anti Sensory (Base 1st Digit)  31.1C  Wrist  2.3   18.7  Wrist Base 1st Digit     Ulnar Anti Sensory (5th Digit)  31.9C  Wrist  3.3   21.3  Wrist 5th Digit  42    Motor Left/Right Comparison   Stim Site L Lat (ms) R Lat (ms) L-R Lat (ms) L Amp (mV) R Amp (mV) L-R Amp (%) Site1 Site2 L Vel (m/s) R Vel (m/s) L-R Vel (m/s)  Median Motor (Abd Poll Brev)  32C  Wrist  *5.8   *4.4  Elbow Wrist  *45   Elbow  10.7   4.3        Ulnar Motor (Abd Dig Min)  32.2C  Wrist  2.9   6.9  B Elbow Wrist  54   B Elbow  6.8   6.5  A Elbow B Elbow  56   A Elbow  8.6   6.4           Waveforms:

## 2022-03-21 NOTE — Progress Notes (Signed)
Marie Elliott - 67 y.o. female MRN [id number]  Date of birth: 1955-09-22  Office Visit Note: Visit Date: 03/10/2022 PCP: Jolinda Croak, MD Referred by: Leandrew Koyanagi, MD  Subjective: Chief Complaint  Patient presents with   Right Hand - Numbness, Pain, Weakness   HPI:  Marie Elliott is a 67 y.o. female who comes in today at the request of Dr. Eduard Roux for evaluation and management of the Right upper extremities.  Patient is Right hand dominant.  She originally went to Dr. Erlinda Hong with trigger finger symptoms.  Did have trigger finger injection.  She has pain numbness and tingling in the radial digits on the right and none on the left.  No frank radicular symptoms.  No prior electrodiagnostic study.   ROS Otherwise per HPI.  Assessment & Plan: Visit Diagnoses:    ICD-10-CM   1. Paresthesia of skin  R20.2 NCV with EMG (electromyography)      Plan: Impression: The above electrodiagnostic study is ABNORMAL and reveals a severe right median nerve neuropathy at the wrist (carpal tunnel syndrome) affecting the sensory and motor nerves.  There was no other focal nerve neuropathy, cervical radiculopathy or brachial plexopathy.  As you know, this particular electrodiagnostic study cannot rule out chemical radiculitis or sensory only radiculopathy.  Recommendations: 1.  Follow-up with referring physician. 2.  Consider surgical evaluation.  Meds & Orders: No orders of the defined types were placed in this encounter.   Orders Placed This Encounter  Procedures   NCV with EMG (electromyography)    Follow-up: No follow-ups on file.   Procedures: No procedures performed  EMG & NCV Findings: Evaluation of the right median motor nerve showed prolonged distal onset latency (5.8 ms), reduced amplitude (4.4 mV), and decreased conduction velocity (Elbow-Wrist, 45 m/s).  The right median (across palm) sensory nerve showed prolonged distal peak latency (Wrist, 11.1 ms), reduced  amplitude (7.1 V), and prolonged distal peak latency (Palm, 3.1 ms).  All remaining nerves (as indicated in the following tables) were within normal limits.    All examined muscles (as indicated in the following table) showed no evidence of electrical instability.    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  There was no other focal nerve neuropathy, cervical radiculopathy or brachial plexopathy.  As you know, this particular electrodiagnostic study cannot rule out chemical radiculitis or sensory only radiculopathy.  Recommendations: 1.  Follow-up with referring physician. 2.  Consider surgical evaluation. ___________________________ Laurence Spates FAAPMR Board Certified, American Board of Physical Medicine and Rehabilitation    Nerve Conduction Studies Anti Sensory Summary Table   Stim Site NR Peak (ms) Norm Peak (ms) P-T Amp (V) Norm P-T Amp Site1 Site2 Delta-P (ms) Dist (cm) Vel (m/s) Norm Vel (m/s)  Right Median Acr Palm Anti Sensory (2nd Digit)  31.4C  Wrist    *11.1 <3.6 *7.1 >10 Wrist Palm 8.0 0.0    Palm    *3.1 <2.0 9.6         Right Radial Anti Sensory (Base 1st Digit)  31.1C  Wrist    2.3 <3.1 18.7  Wrist Base 1st Digit 2.3 0.0    Right Ulnar Anti Sensory (5th Digit)  31.9C  Wrist    3.3 <3.7 21.3 >15.0 Wrist 5th Digit 3.3 14.0 42 >38   Motor Summary Table   Stim Site NR Onset (ms) Norm  Onset (ms) O-P Amp (mV) Norm O-P Amp Site1 Site2 Delta-0 (ms) Dist (cm) Vel (m/s) Norm Vel (m/s)  Right Median Motor (Abd Poll Brev)  32C  Wrist    *5.8 <4.2 *4.4 >5 Elbow Wrist 4.9 22.0 *45 >50  Elbow    10.7  4.3         Right Ulnar Motor (Abd Dig Min)  32.2C  Wrist    2.9 <4.2 6.9 >3 B Elbow Wrist 3.9 21.0 54 >53  B Elbow    6.8  6.5  A Elbow B Elbow 1.8 10.0 56 >53  A Elbow    8.6  6.4          EMG   Side Muscle Nerve Root Ins  Act Fibs Psw Amp Dur Poly Recrt Int Fraser Din Comment  Right Abd Poll Brev Median C8-T1 Nml Nml Nml Nml Nml 0 Nml Nml   Right 1stDorInt Ulnar C8-T1 Nml Nml Nml Nml Nml 0 Nml Nml   Right PronatorTeres Median C6-7 Nml Nml Nml Nml Nml 0 Nml Nml   Right Biceps Musculocut C5-6 Nml Nml Nml Nml Nml 0 Nml Nml   Right Deltoid Axillary C5-6 Nml Nml Nml Nml Nml 0 Nml Nml     Nerve Conduction Studies Anti Sensory Left/Right Comparison   Stim Site L Lat (ms) R Lat (ms) L-R Lat (ms) L Amp (V) R Amp (V) L-R Amp (%) Site1 Site2 L Vel (m/s) R Vel (m/s) L-R Vel (m/s)  Median Acr Palm Anti Sensory (2nd Digit)  31.4C  Wrist  *11.1   *7.1  Wrist Palm     Palm  *3.1   9.6        Radial Anti Sensory (Base 1st Digit)  31.1C  Wrist  2.3   18.7  Wrist Base 1st Digit     Ulnar Anti Sensory (5th Digit)  31.9C  Wrist  3.3   21.3  Wrist 5th Digit  42    Motor Left/Right Comparison   Stim Site L Lat (ms) R Lat (ms) L-R Lat (ms) L Amp (mV) R Amp (mV) L-R Amp (%) Site1 Site2 L Vel (m/s) R Vel (m/s) L-R Vel (m/s)  Median Motor (Abd Poll Brev)  32C  Wrist  *5.8   *4.4  Elbow Wrist  *45   Elbow  10.7   4.3        Ulnar Motor (Abd Dig Min)  32.2C  Wrist  2.9   6.9  B Elbow Wrist  54   B Elbow  6.8   6.5  A Elbow B Elbow  56   A Elbow  8.6   6.4           Waveforms:             Clinical History: No specialty comments available.     Objective:  VS:  HT:    WT:   BMI:     BP:   HR: bpm  TEMP: ( )  RESP:  Physical Exam Musculoskeletal:        General: No swelling, tenderness or deformity.     Comments: Inspection reveals flattening of the right APB but no atrophy of the bilateral APB or FDI or hand intrinsics. There is no swelling, color changes, allodynia or dystrophic changes. There is 5 out of 5 strength in the bilateral wrist extension, finger abduction and long finger flexion. There is intact sensation to light touch in all dermatomal and peripheral nerve distributions. There is a positive  Phalen's test on the right. There is a negative Hoffmann's test bilaterally.  Skin:    General: Skin is warm and dry.     Findings: No erythema or rash.  Neurological:     General: No focal deficit present.     Mental Status: She is alert and oriented to person, place, and time.     Motor: No weakness or abnormal muscle tone.     Coordination: Coordination normal.  Psychiatric:        Mood and Affect: Mood normal.        Behavior: Behavior normal.      Imaging: No results found.

## 2022-03-23 ENCOUNTER — Ambulatory Visit (INDEPENDENT_AMBULATORY_CARE_PROVIDER_SITE_OTHER): Payer: No Typology Code available for payment source | Admitting: Orthopaedic Surgery

## 2022-03-23 DIAGNOSIS — G5601 Carpal tunnel syndrome, right upper limb: Secondary | ICD-10-CM | POA: Insufficient documentation

## 2022-03-23 NOTE — Progress Notes (Signed)
   Office Visit Note   Patient: Marie Elliott           Date of Birth: 1955-12-01           MRN: [id number] Visit Date: 03/23/2022              Requested by: Marie Croak, MD [place],  [place] [address] PCP: Marie Croak, MD   Assessment & Plan: Visit Diagnoses:  1. Right carpal tunnel syndrome     Plan: Impression is a severe right carpal tunnel syndrome found on electrodiagnostic studies.  These findings were reviewed with the patient and treatment options were reviewed I have recommended carpal tunnel release.  Details of the surgery including risk benefits prognosis reviewed.  Questions encouraged and answered.  Marie Elliott will call the patient to confirm surgery date.  Follow-Up Instructions: No follow-ups on file.   Orders:  No orders of the defined types were placed in this encounter.  No orders of the defined types were placed in this encounter.     Procedures: No procedures performed   Clinical Data: No additional findings.   Subjective: Chief Complaint  Patient presents with   Left Hand - Pain    HPI Marie Elliott returns today for review of nerve conduction studies. Review of Systems   Objective: Vital Signs: There were no vitals taken for this visit.  Physical Exam  Ortho Exam Examination right hand is unchanged. Specialty Comments:  No specialty comments available.  Imaging: No results found.   PMFS History: Patient Active Problem List   Diagnosis Date Noted   Right carpal tunnel syndrome 03/23/2022   Hypothyroidism    Menorrhagia    Past Medical History:  Diagnosis Date   BV (bacterial vaginosis)    Fibroids    Hirsutism    Hypertension    Hypothyroidism    Menorrhagia    Polycystic ovaries    Trichomonas     Family History  Problem Relation Age of Onset   Alzheimer's disease Mother    Hypertension Mother    Cancer Father    Hypertension Father    Gout Father    Hypertension Sister     Cancer Brother    Hypertension Brother    Diabetes Maternal Grandmother    Cancer Maternal Grandfather    Epilepsy Brother     Past Surgical History:  Procedure Laterality Date   DILATION AND CURETTAGE OF UTERUS     HYSTEROSCOPY     TUBAL LIGATION     Social History   Occupational History   Not on file  Tobacco Use   Smoking status: Never    Passive exposure: Never   Smokeless tobacco: Never  Vaping Use   Vaping Use: Never used  Substance and Sexual Activity   Alcohol use: Yes    Comment: socially   Drug use: No   Sexual activity: Not Currently    Birth control/protection: Surgical

## 2022-03-27 ENCOUNTER — Other Ambulatory Visit (HOSPITAL_COMMUNITY): Payer: Self-pay | Admitting: Physician Assistant

## 2022-04-04 ENCOUNTER — Other Ambulatory Visit: Payer: Self-pay | Admitting: Physician Assistant

## 2022-04-04 MED ORDER — ONDANSETRON HCL 4 MG PO TABS: 4.0000 mg | ORAL_TABLET | Freq: Three times a day (TID) | ORAL | 0 refills | Status: AC | PRN

## 2022-04-04 MED ORDER — HYDROCODONE-ACETAMINOPHEN 5-325 MG PO TABS: 1.0000 | ORAL_TABLET | Freq: Three times a day (TID) | ORAL | 0 refills | Status: AC | PRN

## 2022-04-06 DIAGNOSIS — G5601 Carpal tunnel syndrome, right upper limb: Secondary | ICD-10-CM | POA: Diagnosis not present

## 2022-04-13 ENCOUNTER — Ambulatory Visit: Payer: No Typology Code available for payment source | Admitting: Physician Assistant

## 2022-04-13 DIAGNOSIS — Z9889 Other specified postprocedural states: Secondary | ICD-10-CM

## 2022-04-13 NOTE — Progress Notes (Addendum)
   Post-Op Visit Note   Patient: Marie Elliott           Date of Birth: 03-21-1955           MRN: [id number] Visit Date: 04/13/2022 PCP: Jolinda Croak, MD   Assessment & Plan:  Chief Complaint:  Chief Complaint  Patient presents with   Right Hand - Routine Post Op   Visit Diagnoses:  1. S/P carpal tunnel release     Plan: Patient is a pleasant 67 year old female who comes in today 1 week status post right carpal tunnel release, date of surgery 04/06/2022.  Prior to surgery, nerve conduction studies demonstrated a severe right median nerve compression.  She has been doing well.  Minimal pain.  She still complains of paresthesias to the index, long and ring fingers.  Examination of her right wrist reveals a well-healing surgical incision with nylon sutures in place.  No evidence of infection or cellulitis.  Fingers are warm and well-perfused.  Today, her wound was cleaned and recovered.  We have provided her with a Velcro splint for which she will wear at all times for the next week.  No submerging her hand in water or lifting anything heavy for the next 3 weeks.  Follow-up next week for suture removal.  Call with concerns or questions.  Follow-Up Instructions: Return in about 1 week (around 04/20/2022).   Orders:  No orders of the defined types were placed in this encounter.  No orders of the defined types were placed in this encounter.   Imaging: No new imaging  PMFS History: Patient Active Problem List   Diagnosis Date Noted   Right carpal tunnel syndrome 03/23/2022   Hypothyroidism    Menorrhagia    Past Medical History:  Diagnosis Date   BV (bacterial vaginosis)    Fibroids    Hirsutism    Hypertension    Hypothyroidism    Menorrhagia    Polycystic ovaries    Trichomonas     Family History  Problem Relation Age of Onset   Alzheimer's disease Mother    Hypertension Mother    Cancer Father    Hypertension Father    Gout Father    Hypertension Sister     Cancer Brother    Hypertension Brother    Diabetes Maternal Grandmother    Cancer Maternal Grandfather    Epilepsy Brother     Past Surgical History:  Procedure Laterality Date   DILATION AND CURETTAGE OF UTERUS     HYSTEROSCOPY     TUBAL LIGATION     Social History   Occupational History   Not on file  Tobacco Use   Smoking status: Never    Passive exposure: Never   Smokeless tobacco: Never  Vaping Use   Vaping Use: Never used  Substance and Sexual Activity   Alcohol use: Yes    Comment: socially   Drug use: No   Sexual activity: Not Currently    Birth control/protection: Surgical

## 2022-04-24 ENCOUNTER — Ambulatory Visit (INDEPENDENT_AMBULATORY_CARE_PROVIDER_SITE_OTHER): Payer: No Typology Code available for payment source | Admitting: Physician Assistant

## 2022-04-24 ENCOUNTER — Encounter: Payer: Self-pay | Admitting: Physician Assistant

## 2022-04-24 DIAGNOSIS — G5601 Carpal tunnel syndrome, right upper limb: Secondary | ICD-10-CM

## 2022-04-24 NOTE — Progress Notes (Signed)
   Post-Op Visit Note   Patient: Marie Elliott           Date of Birth: [birth year]           MRN: [id number] Visit Date: 04/24/2022 PCP: Jolinda Croak, MD   Assessment & Plan:  Chief Complaint:  Chief Complaint  Patient presents with   Right Wrist - Follow-up    Right carpal tunnel release 04/06/2022   Visit Diagnoses:  1. Right carpal tunnel syndrome     Plan: Patient is a pleasant 67 year old female who comes in today 2 weeks status post right carpal tunnel release, date of surgery 04/06/2022.  She has been doing well.  She still notes paresthesias throughout the median nerve distribution.  She has mild discomfort to the peri-incisional area.  Overall, doing better.  Examination of her right hand reveals a well-healed surgical incision with nylon sutures in place.  No evidence of infection or cellulitis.  Today, sutures were removed and Steri-Strips applied.  No heavy lifting or submerging her hand underwater for another 2 weeks.  She may work on range of motion exercises.  Follow-up in 2 weeks for recheck.  Call with concerns or questions.  Follow-Up Instructions: Return in about 2 weeks (around 05/08/2022).   Orders:  No orders of the defined types were placed in this encounter.  No orders of the defined types were placed in this encounter.   Imaging: No new imaging  PMFS History: Patient Active Problem List   Diagnosis Date Noted   Right carpal tunnel syndrome 03/23/2022   Hypothyroidism    Menorrhagia    Past Medical History:  Diagnosis Date   BV (bacterial vaginosis)    Fibroids    Hirsutism    Hypertension    Hypothyroidism    Menorrhagia    Polycystic ovaries    Trichomonas     Family History  Problem Relation Age of Onset   Alzheimer's disease Mother    Hypertension Mother    Cancer Father    Hypertension Father    Gout Father    Hypertension Sister    Cancer Brother    Hypertension Brother    Diabetes Maternal Grandmother    Cancer  Maternal Grandfather    Epilepsy Brother     Past Surgical History:  Procedure Laterality Date   DILATION AND CURETTAGE OF UTERUS     HYSTEROSCOPY     TUBAL LIGATION     Social History   Occupational History   Not on file  Tobacco Use   Smoking status: Never    Passive exposure: Never   Smokeless tobacco: Never  Vaping Use   Vaping Use: Never used  Substance and Sexual Activity   Alcohol use: Yes    Comment: socially   Drug use: No   Sexual activity: Not Currently    Birth control/protection: Surgical

## 2022-05-09 ENCOUNTER — Ambulatory Visit (INDEPENDENT_AMBULATORY_CARE_PROVIDER_SITE_OTHER): Payer: No Typology Code available for payment source | Admitting: Physician Assistant

## 2022-05-09 DIAGNOSIS — Z9889 Other specified postprocedural states: Secondary | ICD-10-CM

## 2022-05-09 DIAGNOSIS — G5601 Carpal tunnel syndrome, right upper limb: Secondary | ICD-10-CM

## 2022-05-09 NOTE — Progress Notes (Signed)
   Post-Op Visit Note   Patient: Marie Elliott           Date of Birth: [birth year]           MRN: [id number] Visit Date: 05/09/2022 PCP: Jolinda Croak, MD   Assessment & Plan:  Chief Complaint:  Chief Complaint  Patient presents with   Right Hand - Routine Post Op   Visit Diagnoses:  1. Right carpal tunnel syndrome   2. S/P carpal tunnel release     Plan: Patient is a pleasant 67 year old female who comes in today approximately 4 weeks status post right carpal tunnel release 04/06/2022.  She has been doing well.  No pain.  She does note paresthesias to the fingertips.  Examination of the right hand reveals a fully healed surgical scar without complication.  Fingers warm well-perfused.  At this point, patient will continue to increase activity as tolerated.  She will follow-up with [place] as needed.  Call with concerns or questions.  Follow-Up Instructions: Return if symptoms worsen or fail to improve.   Orders:  No orders of the defined types were placed in this encounter.  No orders of the defined types were placed in this encounter.   Imaging: No new imaging  PMFS History: Patient Active Problem List   Diagnosis Date Noted   Right carpal tunnel syndrome 03/23/2022   Hypothyroidism    Menorrhagia    Past Medical History:  Diagnosis Date   BV (bacterial vaginosis)    Fibroids    Hirsutism    Hypertension    Hypothyroidism    Menorrhagia    Polycystic ovaries    Trichomonas     Family History  Problem Relation Age of Onset   Alzheimer's disease Mother    Hypertension Mother    Cancer Father    Hypertension Father    Gout Father    Hypertension Sister    Cancer Brother    Hypertension Brother    Diabetes Maternal Grandmother    Cancer Maternal Grandfather    Epilepsy Brother     Past Surgical History:  Procedure Laterality Date   DILATION AND CURETTAGE OF UTERUS     HYSTEROSCOPY     TUBAL LIGATION     Social History   Occupational History    Not on file  Tobacco Use   Smoking status: Never    Passive exposure: Never   Smokeless tobacco: Never  Vaping Use   Vaping Use: Never used  Substance and Sexual Activity   Alcohol use: Yes    Comment: socially   Drug use: No   Sexual activity: Not Currently    Birth control/protection: Surgical

## 2023-02-05 IMAGING — CT CT CERVICAL SPINE W/O CM
3 of 4 series · 12 of 33 positions shown, 14 images · non-contrast
Comparison: None.

CLINICAL DATA: Patient fell 4, striking head on wall. No loss of
consciousness. Headache and neck pain.

EXAM:
CT HEAD WITHOUT CONTRAST
CT CERVICAL SPINE WITHOUT CONTRAST
TECHNIQUE: Multidetector CT imaging of the head and cervical spine was
performed following the standard protocol without intravenous
contrast. Multiplanar CT image reconstructions of the cervical spine
were also generated.

[Series 5: cor bone · coronal · 0.31mm/px · 3 of 63 slices shown]
[im 13/63  bone]
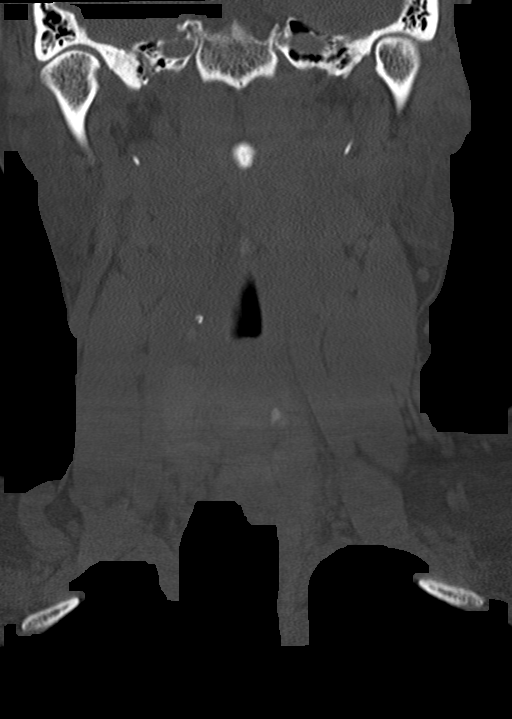
[im 25/63  bone]
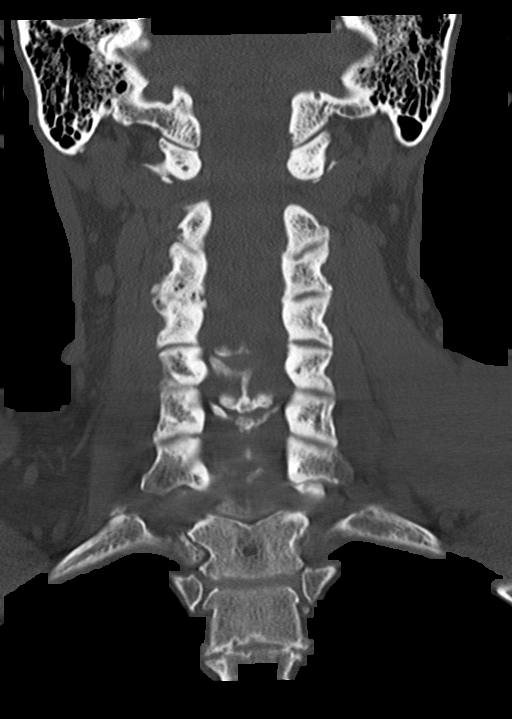
[im 38/63  bone]
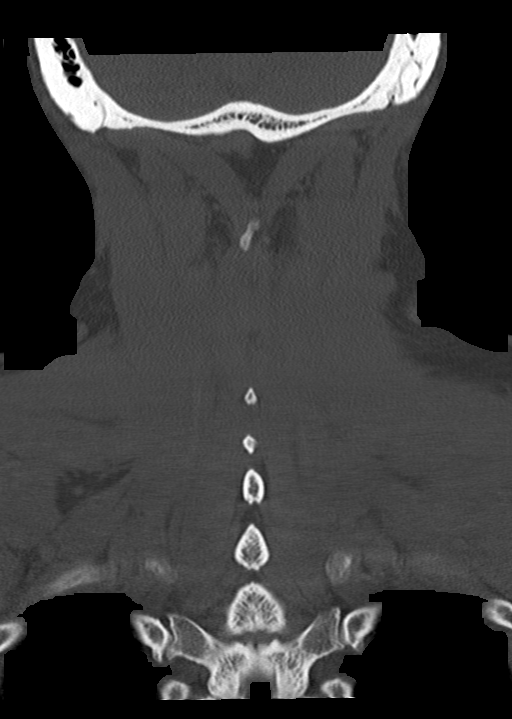

[Series 6: sag bone · sagittal · 0.29mm/px · 5 of 61 slices shown, 6 images]
[im 21/61  bone]
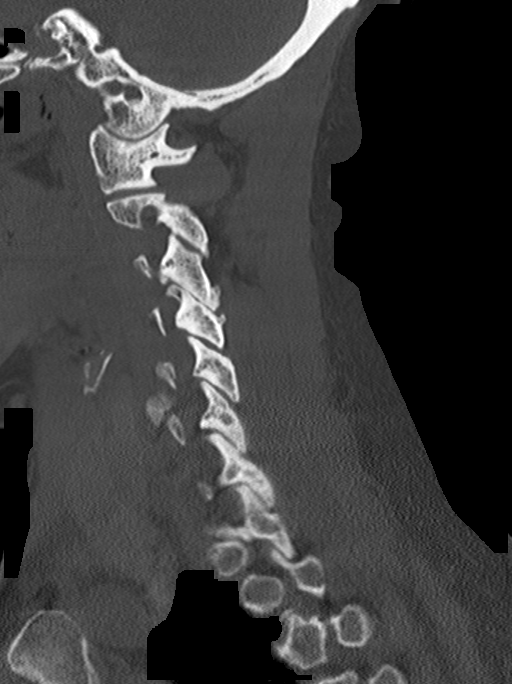
[im 26/61  bone]
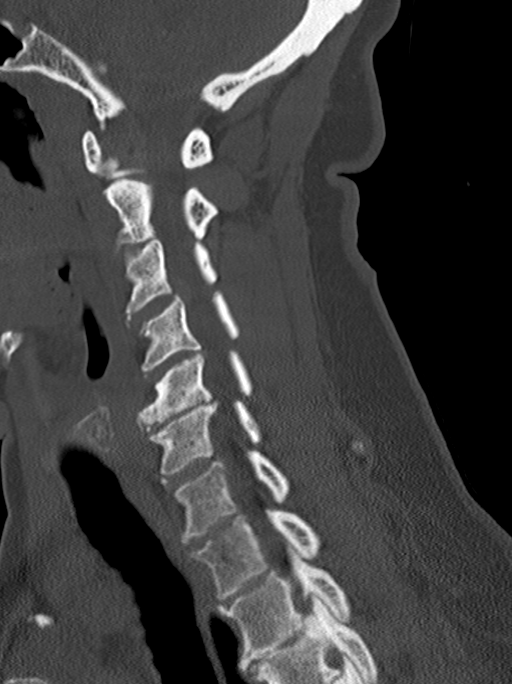
[im 31/61  soft-tissue]
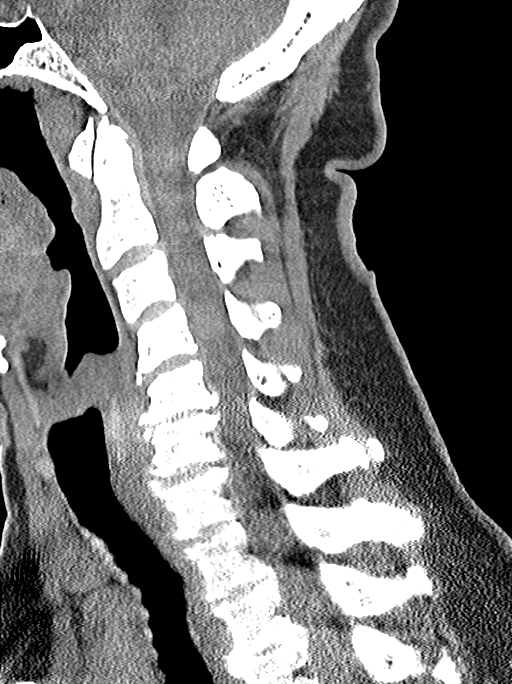
[im 31/61  bone]
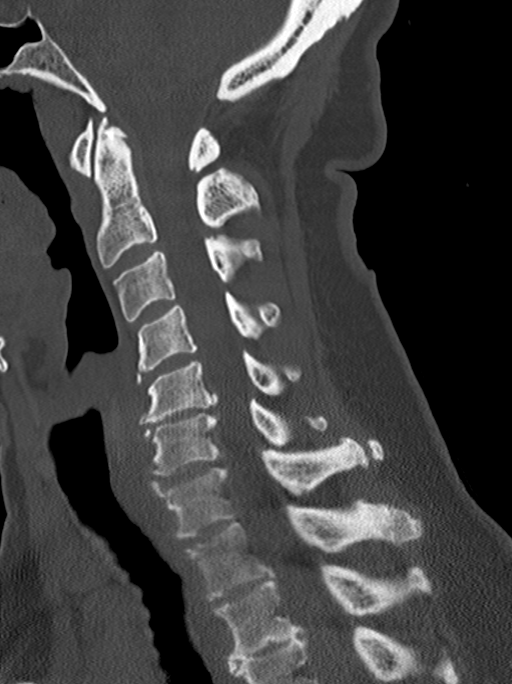
[im 36/61  bone]
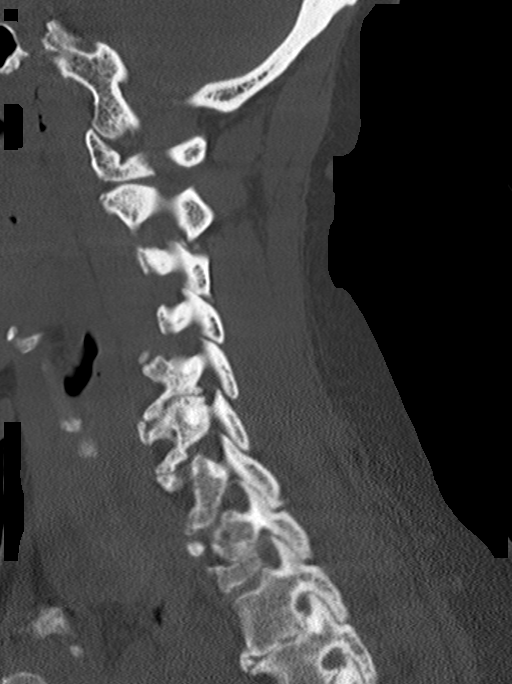
[im 41/61  bone]
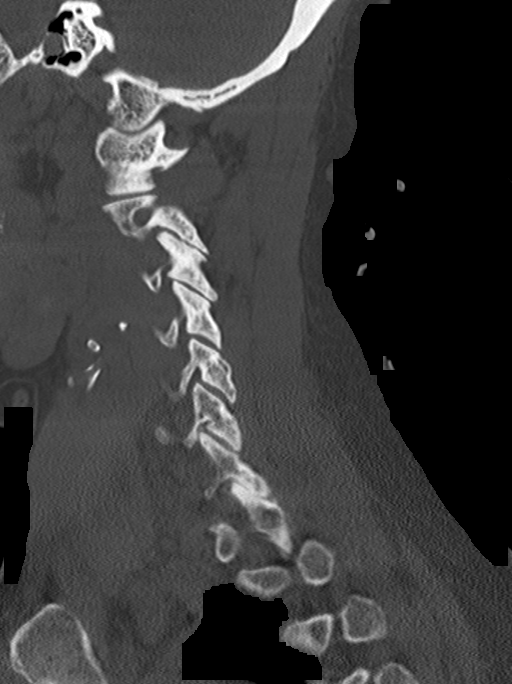

[Series 7: orthogonal axials · axial · 0.21mm/px · z∈[+608,+729]mm · 4 of 97 slices shown, 5 images]
[im 17/97  soft-tissue]
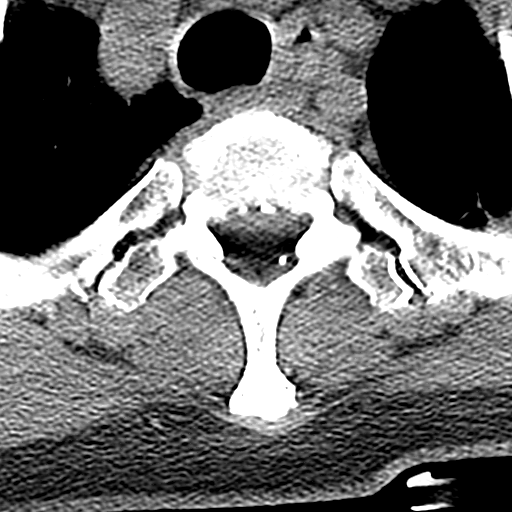
[im 17/97  bone]
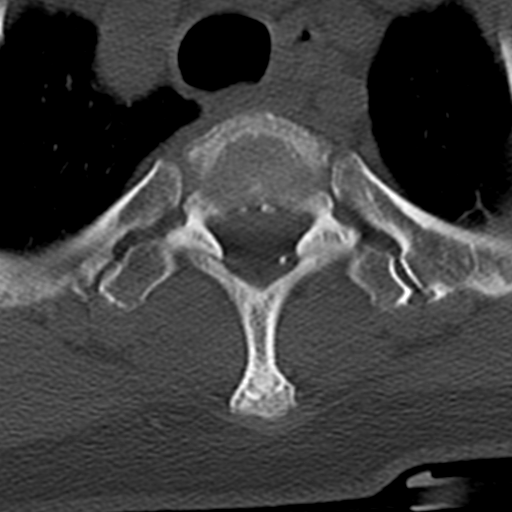
[im 33/97  bone]
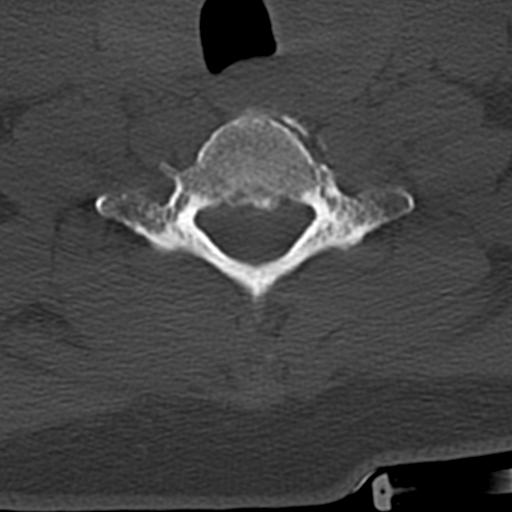
[im 65/97  bone]
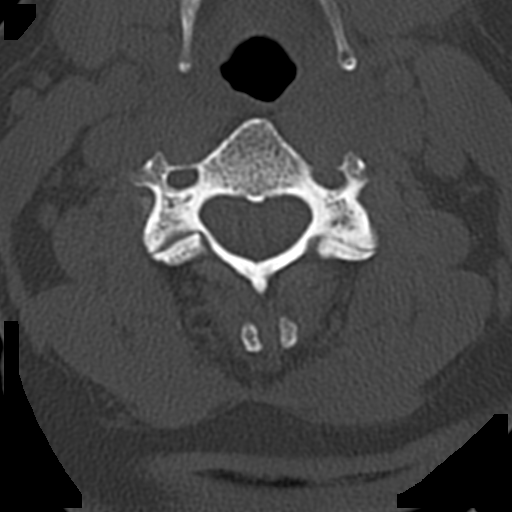
[im 81/97  bone]
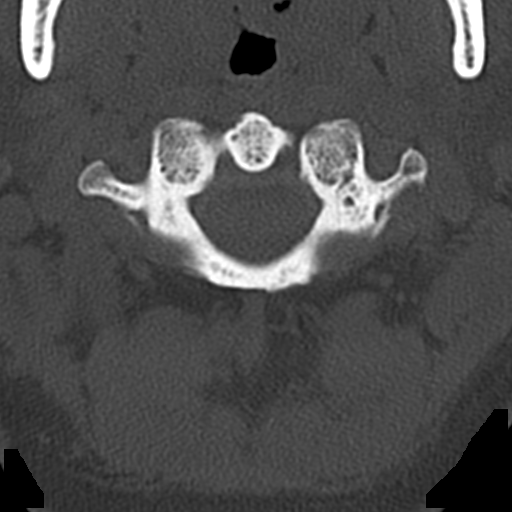

[12 of 33 positions shown; findings below may reference images not displayed]

FINDINGS: CT HEAD FINDINGS

Brain: There is no evidence of acute intracranial hemorrhage, mass
lesion, brain edema or extra-axial fluid collection. The ventricles
and subarachnoid spaces are appropriately sized for age. There is no
CT evidence of acute cortical infarction. Probable mild chronic
small vessel ischemic changes in the periventricular white matter.

Vascular: Intracranial vascular calcifications. No hyperdense vessel
identified.

Skull: Diffuse calvarial hyperostosis. No evidence of acute
fracture.

Sinuses/Orbits: The visualized paranasal sinuses and mastoid air
cells are clear. No orbital abnormalities are seen.

Other: None.

CT CERVICAL SPINE FINDINGS

Alignment: Normal.

Skull base and vertebrae: No evidence of acute fracture or traumatic
subluxation. Chronic calcification of the ligamentum nuchae adjacent
to the C7 spinous process. Multilevel spondylosis with fragmented
endplate osteophytes.

Soft tissues and spinal canal: No prevertebral fluid or swelling. No
visible canal hematoma.

Disc levels: Multilevel spondylosis, greatest at C5-6. Mild
associated foraminal narrowing, worse on the left. No large disc
herniation identified.

Upper chest: Suspected 2.1 cm left thyroid nodule on image 76/4. The
visualized upper chest otherwise appears unremarkable.

Other: None.
IMPRESSION: 1. No acute intracranial or calvarial findings.
2. No evidence of acute cervical spine fracture, traumatic
subluxation or static signs of instability.
3. Cervical spondylosis as described.
4. Thyroid heterogeneity with suspected 2.1 cm left-sided nodule.
Recommend thyroid US.(Ref: [HOSPITAL]. [DATE]):
